# Patient Record
Sex: Male | Born: 1975 | Race: Black or African American | Hispanic: No | Marital: Single | Smoking: Never smoker
Health system: Southern US, Community
[De-identification: ages and names within clinical notes are randomized; demographics above are authoritative.]

---

## 1999-03-21 ENCOUNTER — Emergency Department (HOSPITAL_COMMUNITY): Admission: EM | Admit: 1999-03-21 | Discharge: 1999-03-21 | Payer: Self-pay | Admitting: *Deleted

## 1999-08-03 ENCOUNTER — Emergency Department (HOSPITAL_COMMUNITY): Admission: EM | Admit: 1999-08-03 | Discharge: 1999-08-03 | Payer: Self-pay | Admitting: Emergency Medicine

## 2000-12-07 ENCOUNTER — Emergency Department (HOSPITAL_COMMUNITY): Admission: EM | Admit: 2000-12-07 | Discharge: 2000-12-07 | Payer: Self-pay | Admitting: Emergency Medicine

## 2002-03-09 ENCOUNTER — Emergency Department (HOSPITAL_COMMUNITY): Admission: EM | Admit: 2002-03-09 | Discharge: 2002-03-09 | Payer: Self-pay | Admitting: *Deleted

## 2002-04-09 ENCOUNTER — Emergency Department (HOSPITAL_COMMUNITY): Admission: EM | Admit: 2002-04-09 | Discharge: 2002-04-09 | Payer: Self-pay | Admitting: Emergency Medicine

## 2016-03-21 ENCOUNTER — Encounter (HOSPITAL_COMMUNITY): Payer: Self-pay | Admitting: Emergency Medicine

## 2016-03-21 ENCOUNTER — Emergency Department (HOSPITAL_COMMUNITY)
Admission: EM | Admit: 2016-03-21 | Discharge: 2016-03-21 | Disposition: A | Discharge status: Home / Self Care | Payer: Self-pay | Attending: Emergency Medicine | Admitting: Emergency Medicine

## 2016-03-21 ENCOUNTER — Emergency Department (HOSPITAL_COMMUNITY): Payer: Self-pay

## 2016-03-21 DIAGNOSIS — S40021A Contusion of right upper arm, initial encounter: Secondary | ICD-10-CM | POA: Insufficient documentation

## 2016-03-21 DIAGNOSIS — S0993XA Unspecified injury of face, initial encounter: Secondary | ICD-10-CM | POA: Insufficient documentation

## 2016-03-21 DIAGNOSIS — S00531A Contusion of lip, initial encounter: Secondary | ICD-10-CM | POA: Insufficient documentation

## 2016-03-21 DIAGNOSIS — Y9289 Other specified places as the place of occurrence of the external cause: Secondary | ICD-10-CM | POA: Insufficient documentation

## 2016-03-21 DIAGNOSIS — Y9389 Activity, other specified: Secondary | ICD-10-CM | POA: Insufficient documentation

## 2016-03-21 DIAGNOSIS — Y998 Other external cause status: Secondary | ICD-10-CM | POA: Insufficient documentation

## 2016-03-21 MED ORDER — OXYCODONE-ACETAMINOPHEN 5-325 MG PO TABS
2.0000 | ORAL_TABLET | Freq: Once | ORAL | Status: AC
  Administered 2016-03-21: 2 via ORAL
  Filled 2016-03-21: qty 2

## 2016-03-21 MED ORDER — CYCLOBENZAPRINE HCL 10 MG PO TABS: 10.0000 mg | ORAL_TABLET | Freq: Two times a day (BID) | ORAL | Status: AC | PRN

## 2016-03-21 MED ORDER — HYDROCODONE-ACETAMINOPHEN 5-325 MG PO TABS: 1.0000 | ORAL_TABLET | Freq: Four times a day (QID) | ORAL | Status: AC | PRN

## 2016-03-21 MED ORDER — IBUPROFEN 800 MG PO TABS: 800.0000 mg | ORAL_TABLET | Freq: Three times a day (TID) | ORAL | Status: AC

## 2016-03-21 NOTE — Discharge Instructions (Signed)
Hematoma A hematoma is a collection of blood under the skin, in an organ, in a body space, in a joint space, or in other tissue. The blood can clot to form a lump that you can see and feel. The lump is often firm and may sometimes become sore and tender. Most hematomas get better in a few days to weeks. However, some hematomas may be serious and require medical care. Hematomas can range in size from very small to very large. CAUSES  A hematoma can be caused by a blunt or penetrating injury. It can also be caused by spontaneous leakage from a blood vessel under the skin. Spontaneous leakage from a blood vessel is more likely to occur in older people, especially those taking blood thinners. Sometimes, a hematoma can develop after certain medical procedures. SIGNS AND SYMPTOMS   A firm lump on the body.  Possible pain and tenderness in the area.  Bruising.Blue, dark blue, purple-red, or yellowish skin may appear at the site of the hematoma if the hematoma is close to the surface of the skin. For hematomas in deeper tissues or body spaces, the signs and symptoms may be subtle. For example, an intra-abdominal hematoma may cause abdominal pain, weakness, fainting, and shortness of breath. An intracranial hematoma may cause a headache or symptoms such as weakness, trouble speaking, or a change in consciousness. DIAGNOSIS  A hematoma can usually be diagnosed based on your medical history and a physical exam. Imaging tests may be needed if your health care provider suspects a hematoma in deeper tissues or body spaces, such as the abdomen, head, or chest. These tests may include ultrasonography or a CT scan.  TREATMENT  Hematomas usually go away on their own over time. Rarely does the blood need to be drained out of the body. Large hematomas or those that may affect vital organs will sometimes need surgical drainage or monitoring. HOME CARE INSTRUCTIONS   Apply ice to the injured area:   Put ice in a  plastic bag.   Place a towel between your skin and the bag.   Leave the ice on for 20 minutes, 2-3 times a day for the first 1 to 2 days.   After the first 2 days, switch to using warm compresses on the hematoma.   Elevate the injured area to help decrease pain and swelling. Wrapping the area with an elastic bandage may also be helpful. Compression helps to reduce swelling and promotes shrinking of the hematoma. Make sure the bandage is not wrapped too tight.   If your hematoma is on a lower extremity and is painful, crutches may be helpful for a couple days.   Only take over-the-counter or prescription medicines as directed by your health care provider. SEEK IMMEDIATE MEDICAL CARE IF:   You have increasing pain, or your pain is not controlled with medicine.   You have a fever.   You have worsening swelling or discoloration.   Your skin over the hematoma breaks or starts bleeding.   Your hematoma is in your chest or abdomen and you have weakness, shortness of breath, or a change in consciousness.  Your hematoma is on your scalp (caused by a fall or injury) and you have a worsening headache or a change in alertness or consciousness. MAKE SURE YOU:   Understand these instructions.  Will watch your condition.  Will get help right away if you are not doing well or get worse.   This information is not intended to replace  advice given to you by your health care provider. Make sure you discuss any questions you have with your health care provider.   Document Released: 06/06/2004 Document Revised: 06/25/2013 Document Reviewed: 04/02/2013 Elsevier Interactive Patient Education 2016 [employer]  Facial or Scalp Contusion A facial or scalp contusion is a deep bruise on the face or head. Injuries to the face and head generally cause a lot of swelling, especially around the eyes. Contusions are the result of an injury that caused bleeding under the skin. The contusion may turn  blue, purple, or yellow. Minor injuries will give you a painless contusion, but more severe contusions may stay painful and swollen for a few weeks.  CAUSES  A facial or scalp contusion is caused by a blunt injury or trauma to the face or head area.  SIGNS AND SYMPTOMS   Swelling of the injured area.   Discoloration of the injured area.   Tenderness, soreness, or pain in the injured area.  DIAGNOSIS  The diagnosis can be made by taking a medical history and doing a physical exam. An X-ray exam, CT scan, or MRI may be needed to determine if there are any associated injuries, such as broken bones (fractures). TREATMENT  Often, the best treatment for a facial or scalp contusion is applying cold compresses to the injured area. Over-the-counter medicines may also be recommended for pain control.  HOME CARE INSTRUCTIONS   Only take over-the-counter or prescription medicines as directed by your health care provider.   Apply ice to the injured area.   Put ice in a plastic bag.   Place a towel between your skin and the bag.   Leave the ice on for 20 minutes, 2-3 times a day.  SEEK MEDICAL CARE IF:  You have bite problems.   You have pain with chewing.   You are concerned about facial defects. SEEK IMMEDIATE MEDICAL CARE IF:  You have severe pain or a headache that is not relieved by medicine.   You have unusual sleepiness, confusion, or personality changes.   You throw up (vomit).   You have a persistent nosebleed.   You have double vision or blurred vision.   You have fluid drainage from your nose or ear.   You have difficulty walking or using your arms or legs.  MAKE SURE YOU:   Understand these instructions.  Will watch your condition.  Will get help right away if you are not doing well or get worse.   This information is not intended to replace advice given to you by your health care provider. Make sure you discuss any questions you have with your  health care provider.   Document Released: 11/30/2004 Document Revised: 11/13/2014 Document Reviewed: 06/05/2013 Elsevier Interactive Patient Education 2016 [employer].   General Assault Assault includes any behavior or physical attack--whether it is on purpose or not--that results in injury to another person, damage to property, or both. This also includes assault that has not yet happened, but is planned to happen. Threats of assault may be physical, verbal, or written. They may be said or sent by:  Mail.  E-mail.  Text.  Social media.  Fax. The threats may be direct, implied, or understood. WHAT ARE THE DIFFERENT FORMS OF ASSAULT? Forms of assault include:  Physically assaulting a person. This includes physical threats to inflict physical harm as well as:  Slapping.  Hitting.  Poking.  Kicking.  Punching.  Pushing.  Sexually assaulting a person. Sexual assault is any  sexual activity that a person is forced, threatened, or coerced to participate in. It may or may not involve physical contact with the person who is assaulting you. You are sexually assaulted if you are forced to have sexual contact of any kind.  Damaging or destroying a person's assistive equipment, such as glasses, canes, or walkers.  Throwing or hitting objects.  Using or displaying a weapon to harm or threaten someone.  Using or displaying an object that appears to be a weapon in a threatening manner.  Using greater physical size or strength to intimidate someone.  Making intimidating or threatening gestures.  Bullying.  Hazing.  Using language that is intimidating, threatening, hostile, or abusive.  Stalking.  Restraining someone with force. WHAT SHOULD I DO IF I EXPERIENCE ASSAULT?  Report assaults, threats, and stalking to the police. Call your local emergency services (911 in the [place] if you are in immediate danger or you need medical help.  You can work with a [employer] or an  advocate to get legal protection against someone who has assaulted you or threatened you with assault. Protection includes restraining orders and private addresses. Crimes against you, such as assault, can also be prosecuted through the courts. Laws will vary depending on where you live.   This information is not intended to replace advice given to you by your health care provider. Make sure you discuss any questions you have with your health care provider.   Document Released: 10/23/2005 Document Revised: 11/13/2014 Document Reviewed: 07/10/2014 Elsevier Interactive Patient Education [employer]2016 Else[employer] Inc.
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
## 2016-03-21 NOTE — ED Notes (Signed)
NAD at this time. Pt is stable and going home.  
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
## 2019-08-12 ENCOUNTER — Other Ambulatory Visit: Payer: Self-pay

## 2019-08-12 DIAGNOSIS — Z20822 Contact with and (suspected) exposure to covid-19: Secondary | ICD-10-CM

## 2019-08-14 LAB — NOVEL CORONAVIRUS, NAA: SARS-CoV-2, NAA: NOT DETECTED
